# Patient Record
Sex: Male | Born: 1965 | Race: Black or African American | Hispanic: No | Marital: Married | Smoking: Former smoker
Health system: Southern US, Community
[De-identification: ages and names within clinical notes are randomized; demographics above are authoritative.]

---

## 2004-07-02 ENCOUNTER — Emergency Department: Payer: Self-pay | Admitting: Emergency Medicine

## 2004-08-13 ENCOUNTER — Emergency Department: Payer: Self-pay | Admitting: Unknown Physician Specialty

## 2004-11-17 ENCOUNTER — Emergency Department: Payer: Self-pay | Admitting: Emergency Medicine

## 2014-08-05 ENCOUNTER — Emergency Department
Admit: 2014-08-05 | Disposition: A | Discharge status: Home / Self Care | Payer: Self-pay | Admitting: Emergency Medicine

## 2019-02-25 ENCOUNTER — Emergency Department
Admission: EM | Admit: 2019-02-25 | Discharge: 2019-02-26 | Disposition: A | Discharge status: Home / Self Care | Payer: No Typology Code available for payment source | Attending: Emergency Medicine | Admitting: Emergency Medicine

## 2019-02-25 ENCOUNTER — Emergency Department: Payer: No Typology Code available for payment source

## 2019-02-25 ENCOUNTER — Other Ambulatory Visit: Payer: Self-pay

## 2019-02-25 ENCOUNTER — Encounter: Payer: Self-pay | Admitting: Emergency Medicine

## 2019-02-25 DIAGNOSIS — Z87891 Personal history of nicotine dependence: Secondary | ICD-10-CM | POA: Insufficient documentation

## 2019-02-25 DIAGNOSIS — R079 Chest pain, unspecified: Secondary | ICD-10-CM | POA: Insufficient documentation

## 2019-02-25 DIAGNOSIS — R1084 Generalized abdominal pain: Secondary | ICD-10-CM | POA: Insufficient documentation

## 2019-02-25 LAB — LIPASE, BLOOD: Lipase: 22 U/L (ref 11–51)

## 2019-02-25 LAB — TROPONIN I (HIGH SENSITIVITY)
Troponin I (High Sensitivity): <2 ng/L (ref <18)
Troponin I (High Sensitivity): 5 ng/L (ref <18)

## 2019-02-25 LAB — BASIC METABOLIC PANEL
Anion gap: 8 (ref 5–15)
BUN: 15 mg/dL (ref 6–20)
CO2: 22 mmol/L (ref 22–32)
Calcium: 9.4 mg/dL (ref 8.9–10.3)
Chloride: 108 mmol/L (ref 98–111)
Creatinine, Ser: 1.11 mg/dL (ref 0.61–1.24)
GFR calc Af Amer: >60 mL/min (ref >60)
GFR calc non Af Amer: >60 mL/min (ref >60)
Glucose, Bld: 99 mg/dL (ref 70–99)
Potassium: 4.1 mmol/L (ref 3.5–5.1)
Sodium: 138 mmol/L (ref 135–145)

## 2019-02-25 LAB — CBC
HCT: 42.1 % (ref 39.0–52.0)
Hemoglobin: 13.5 g/dL (ref 13.0–17.0)
MCH: 27.4 pg (ref 26.0–34.0)
MCHC: 32.1 g/dL (ref 30.0–36.0)
MCV: 85.6 fL (ref 80.0–100.0)
Platelets: 229 10*3/uL (ref 150–400)
RBC: 4.92 MIL/uL (ref 4.22–5.81)
RDW: 14.2 % (ref 11.5–15.5)
WBC: 6.7 10*3/uL (ref 4.0–10.5)
nRBC: 0 % (ref 0.0–0.2)

## 2019-02-25 LAB — HEPATIC FUNCTION PANEL
ALT: 24 U/L (ref 0–44)
AST: 22 U/L (ref 15–41)
Albumin: 4 g/dL (ref 3.5–5.0)
Alkaline Phosphatase: 69 U/L (ref 38–126)
Bilirubin, Direct: <0.1 mg/dL (ref 0.0–0.2)
Total Bilirubin: 0.5 mg/dL (ref 0.3–1.2)
Total Protein: 7.6 g/dL (ref 6.5–8.1)

## 2019-02-25 MED ORDER — LIDOCAINE VISCOUS HCL 2 % MT SOLN
15.0000 mL | Freq: Once | OROMUCOSAL | Status: AC
  Administered 2019-02-25: 15 mL via ORAL
  Filled 2019-02-25: qty 15

## 2019-02-25 MED ORDER — PANTOPRAZOLE SODIUM 20 MG PO TBEC
20.0000 mg | DELAYED_RELEASE_TABLET | Freq: Once | ORAL | Status: AC
  Administered 2019-02-25: 20 mg via ORAL
  Filled 2019-02-25: qty 1

## 2019-02-25 MED ORDER — PANTOPRAZOLE SODIUM 20 MG PO TBEC: 20.0000 mg | DELAYED_RELEASE_TABLET | Freq: Every day | ORAL | 0 refills | Status: DC

## 2019-02-25 MED ORDER — IOHEXOL 350 MG/ML SOLN
100.0000 mL | Freq: Once | INTRAVENOUS | Status: AC | PRN
  Administered 2019-02-25: 100 mL via INTRAVENOUS

## 2019-02-25 MED ORDER — SUCRALFATE 1 G PO TABS: 1.0000 g | ORAL_TABLET | Freq: Four times a day (QID) | ORAL | 0 refills | Status: AC

## 2019-02-25 MED ORDER — ONDANSETRON HCL 4 MG/2ML IJ SOLN
4.0000 mg | Freq: Once | INTRAMUSCULAR | Status: AC
  Administered 2019-02-25: 4 mg via INTRAVENOUS
  Filled 2019-02-25: qty 2

## 2019-02-25 MED ORDER — ALUM & MAG HYDROXIDE-SIMETH 200-200-20 MG/5ML PO SUSP
30.0000 mL | Freq: Once | ORAL | Status: AC
  Administered 2019-02-25: 30 mL via ORAL
  Filled 2019-02-25: qty 30

## 2019-02-25 MED ORDER — ACETAMINOPHEN 500 MG PO TABS
1000.0000 mg | ORAL_TABLET | Freq: Once | ORAL | Status: AC
  Administered 2019-02-25: 1000 mg via ORAL
  Filled 2019-02-25: qty 2

## 2019-02-25 MED ORDER — SUCRALFATE 1 G PO TABS
1.0000 g | ORAL_TABLET | Freq: Once | ORAL | Status: AC
  Administered 2019-02-25: 1 g via ORAL
  Filled 2019-02-25: qty 1

## 2019-02-25 MED ORDER — KETOROLAC TROMETHAMINE 30 MG/ML IJ SOLN
15.0000 mg | Freq: Once | INTRAMUSCULAR | Status: AC
  Administered 2019-02-25: 15 mg via INTRAVENOUS
  Filled 2019-02-25: qty 1

## 2019-02-25 NOTE — ED Triage Notes (Signed)
Pt arrived via POV with reports of chest pain that comes and goes for the past several weeks.  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  Pt reports hx of acid reflux as well.  Pt states about 2 weeks ago he got short of breath and dizzy while at a truck stop, but did not get evaluated.  Pt denies any PMH and is currently not on any medications.  Pt denies any BLE or history of blood clots.

## 2019-02-25 NOTE — ED Provider Notes (Signed)
[facility] Emergency Department Provider Note  ____________________________________________   First MD Initiated Contact with Patient 02/25/19 2130     (approximate)  I have reviewed the triage vital signs and the nursing notes.   HISTORY  Chief Complaint Chest Pain    HPI Craig Mills is a 53 y.o. 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  Patient says he has had having intermittent chest pain for the past 2 weeks that is severe, sharp stabbing sensation, nothing makes it better, nothing seems to bring it on.  He had an episode 2 weeks ago where he felt dizzy and short of breath with it.  He is a [employer].  No leg swelling.  He also endorses severe abdominal pain that is been going on for the past few weeks as well, intermittent, nothing brings it on, nothing makes it worse.  He says that he just feels that there is something wrong with him and that he needs to be further evaluated.    History reviewed. No pertinent past medical history.  There are no active problems to display for this patient.   History reviewed. No pertinent surgical history.  Prior to Admission medications   Not on File    Allergies Aspirin  No family history on file.  Social History Social History   Tobacco Use   Smoking status: Former Smoker   Smokeless tobacco: Never Used  Substance Use Topics   Alcohol use: Yes   Drug use: Not on file      Review of Systems Constitutional: No fever/chills Eyes: No visual changes. ENT: No sore throat. Cardiovascular: Positive chest pain Respiratory: Denies shortness of breath currently but prior has had  Gastrointestinal: + abd pain no nausea, no vomiting.  No diarrhea.  No constipation. Genitourinary: Negative for dysuria. Musculoskeletal: Negative for back pain. Skin: Negative for rash. Neurological: Negative for headaches, focal weakness or numbness. All  other ROS negative ____________________________________________   PHYSICAL EXAM:  VITAL SIGNS: ED Triage Vitals  Enc Vitals Group     BP 02/25/19 1738 128/76     Pulse Rate 02/25/19 1738 93     Resp 02/25/19 1738 16     Temp 02/25/19 1738 99.7 F (37.6 C)     Temp Source 02/25/19 1738 Oral     SpO2 02/25/19 1738 92 %     Weight 02/25/19 1753 190 lb (86.2 kg)     Height 02/25/19 1753 5\' 9"  (1.753 m)     Head Circumference --      Peak Flow --      Pain Score 02/25/19 1753 8     Pain Loc --      Pain Edu? --      Excl. in GC? --     Constitutional: Alert and oriented. Well appearing and in no acute distress. Eyes: Conjunctivae are normal. EOMI. Head: Atraumatic. Nose: No congestion/rhinnorhea. Mouth/Throat: Mucous membranes are moist.   Neck: No stridor. Trachea Midline. FROM Cardiovascular: Normal rate, regular rhythm. Grossly normal heart sounds.  Good peripheral circulation. Respiratory: Normal respiratory effort.  No retractions. Lungs CTAB. Gastrointestinal: Soft and nontender but reports tendernessness diffusely without touching . No distention. No abdominal bruits.  Musculoskeletal: No lower extremity tenderness nor edema.  No joint effusions. Neurologic:  Normal speech and language. No gross focal neurologic deficits are appreciated.  Skin:  Skin is warm, dry and intact. No rash noted. Psychiatric: Mood and affect are  normal. Speech and behavior are normal. GU: Deferred   ____________________________________________   LABS (all labs ordered are listed, but only abnormal results are displayed)  Labs Reviewed  BASIC METABOLIC PANEL  CBC  TROPONIN I (HIGH SENSITIVITY)  TROPONIN I (HIGH SENSITIVITY)   ____________________________________________   ED ECG REPORT I, Concha Se, the attending physician, personally viewed and interpreted this ECG.  EKG shows normal sinus rate of 92, no ST elevation, T wave inversion in lead  III ____________________________________________  RADIOLOGY Vela Prose, personally viewed and evaluated these images (plain radiographs) as part of my medical decision making, as well as reviewing the written report by the radiologist.  ED MD interpretation:  No pna   Official radiology report(s): Dg Chest 2 View  Result Date: 02/25/2019 CLINICAL DATA:  Chest pain EXAM: CHEST - 2 VIEW COMPARISON:  None. FINDINGS: The heart size and mediastinal contours are within normal limits. Both lungs are clear. The visualized skeletal structures are unremarkable. IMPRESSION: No active cardiopulmonary disease. Electronically Signed   By: Gerome Sam III M.D   On: 02/25/2019 19:01   Ct Angio Chest Pe W And/or Wo Contrast  Result Date: 02/25/2019 CLINICAL DATA:  Abdominal distension, chest pain and comes and goes for several weeks, long distance truck driver, RIGHT and chest pain with intermittent LEFT arm numbness and back pain, acid reflux, episode of dizziness and shortness of breath 2 weeks ago, former smoker EXAM: CT ANGIOGRAPHY CHEST CT ABDOMEN AND PELVIS WITH CONTRAST TECHNIQUE: Multidetector CT imaging of the chest was performed using the standard protocol during bolus administration of intravenous contrast. Multiplanar CT image reconstructions and MIPs were obtained to evaluate the vascular anatomy. Multidetector CT imaging of the abdomen and pelvis was performed using the standard protocol during bolus administration of intravenous contrast. CONTRAST:  OMNIPAQUE IOHEXOL 350 MG/ML SOLN COMPARISON:  None FINDINGS: CTA CHEST FINDINGS Cardiovascular: Aorta normal caliber without aneurysm or dissection. Heart unremarkable. No pericardial effusion. Pulmonary arteries adequately opacified and patent. No evidence of pulmonary embolism. Mediastinum/Nodes: Minimal thickening of distal esophagus diffusely which could be related to reflux. Base of cervical region normal appearance. No thoracic  adenopathy. Lungs/Pleura: Minimal dependent atelectasis in the posterior lungs. Anterior blebs RIGHT upper lobe. Lungs otherwise clear. No pulmonary infiltrate, pleural effusion, or pneumothorax. Musculoskeletal: No acute osseous findings. Review of the MIP images confirms the above findings. CT ABDOMEN and PELVIS FINDINGS Hepatobiliary: Cyst LEFT lobe liver lateral segment 19 x 13 mm image 19. 6 mm nonspecific low-attenuation focus lateral segment LEFT lobe image 11. Gallbladder and liver otherwise normal appearance Pancreas: Normal appearance Spleen: Normal appearance Adrenals/Urinary Tract: Adrenal glands, kidneys, ureters, and bladder normal appearance Stomach/Bowel: Normal appendix. Stomach decompressed. Bowel loops unremarkable. Vascular/Lymphatic: Atherosclerotic calcifications aorta and iliac arteries without aneurysm. Reproductive: Prostate gland and seminal vesicles unremarkable Other: No free air or free fluid. No inflammatory process or hernia. Musculoskeletal: Unremarkable Review of the MIP images confirms the above findings. IMPRESSION: No evidence of pulmonary embolism. No acute intrathoracic process. No acute intra-abdominal or intrapelvic abnormalities. Electronically Signed   By: Ulyses Southward M.D.   On: 02/25/2019 22:40   Ct Abdomen Pelvis W Contrast  Result Date: 02/25/2019 CLINICAL DATA:  Abdominal distension, chest pain and comes and goes for several weeks, long distance truck driver, RIGHT and chest pain with intermittent LEFT arm numbness and back pain, acid reflux, episode of dizziness and shortness of breath 2 weeks ago, former smoker EXAM: CT ANGIOGRAPHY CHEST CT ABDOMEN AND PELVIS WITH  CONTRAST TECHNIQUE: Multidetector CT imaging of the chest was performed using the standard protocol during bolus administration of intravenous contrast. Multiplanar CT image reconstructions and MIPs were obtained to evaluate the vascular anatomy. Multidetector CT imaging of the abdomen and pelvis was  performed using the standard protocol during bolus administration of intravenous contrast. CONTRAST:  100mL OMNIPAQUE IOHEXOL 350 MG/ML SOLN COMPARISON:  None FINDINGS: CTA CHEST FINDINGS Cardiovascular: Aorta normal caliber without aneurysm or dissection. Heart unremarkable. No pericardial effusion. Pulmonary arteries adequately opacified and patent. No evidence of pulmonary embolism. Mediastinum/Nodes: Minimal thickening of distal esophagus diffusely which could be related to reflux. Base of cervical region normal appearance. No thoracic adenopathy. Lungs/Pleura: Minimal dependent atelectasis in the posterior lungs. Anterior blebs RIGHT upper lobe. Lungs otherwise clear. No pulmonary infiltrate, pleural effusion, or pneumothorax. Musculoskeletal: No acute osseous findings. Review of the MIP images confirms the above findings. CT ABDOMEN and PELVIS FINDINGS Hepatobiliary: Cyst LEFT lobe liver lateral segment 19 x 13 mm image 19. 6 mm nonspecific low-attenuation focus lateral segment LEFT lobe image 11. Gallbladder and liver otherwise normal appearance Pancreas: Normal appearance Spleen: Normal appearance Adrenals/Urinary Tract: Adrenal glands, kidneys, ureters, and bladder normal appearance Stomach/Bowel: Normal appendix. Stomach decompressed. Bowel loops unremarkable. Vascular/Lymphatic: Atherosclerotic calcifications aorta and iliac arteries without aneurysm. Reproductive: Prostate gland and seminal vesicles unremarkable Other: No free air or free fluid. No inflammatory process or hernia. Musculoskeletal: Unremarkable Review of the MIP images confirms the above findings. IMPRESSION: No evidence of pulmonary embolism. No acute intrathoracic process. No acute intra-abdominal or intrapelvic abnormalities. Electronically Signed   By: Mark  Boles M.Ulyses Southward.   On: 02/25/2019 22:40    ____________________________________________   PROCEDURES  Procedure(s) performed (including Critical  Care):  Procedures   ____________________________________________   INITIAL IMPRESSION / ASSESSMENT AND PLAN / ED COURSE   Craig Mills was evaluated in Emergency Department on 02/25/2019 for the symptoms described in the history of present illness. He was evaluated in the context of the global COVID-19 pandemic, which necessitated consideration that the patient might be at risk for infection with the SARS-CoV-2 virus that causes COVID-19. Institutional protocols and algorithms that pertain to the evaluation of patients at risk for COVID-19 are in a state of rapid change based on information released by regulatory bodies including the CDC and federal and state organizations. These policies and algorithms were followed during the patient's care in the ED.    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  Denies any urinary symptoms to suggest UTI    DDx that was also considered d/t potential to cause harm, but was found less likely based on history and physical (as detailed above): -PNA (no fevers, cough but CXR to evaluate) -PNX (reassured with equal b/l breath sounds, CXR to evaluate) -Symptomatic anemia (will get H&H) -Aortic Dissection as no tearing pain and no radiation to the mid back, pulses equal -Pericarditis no rub on exam, EKG changes or hx to suggest dx -Tamponade (no notable SOB, tachycardic, hypotensive) -Esophageal rupture (no h/o diffuse vomitting/no crepitus)   Chest x-ray without evidence of pneumonia or pneumothorax.  Cardiac marker is negative.  No anemia.  No elevated white count.  CT scans were negative of the chest and belly.  Evaluated patient.  Patient says that in the past he has  been treated for acid reflux and is helped his symptoms.  Will give GI cocktail,  Pepcid, Carafate.  Explained to patient that he should follow-up with his primary care doctor and will give a short course of Pepcid and Carafate.  Patient felt comfortable with this plan.      ____________________________________________   FINAL CLINICAL IMPRESSION(S) / ED DIAGNOSES   Final diagnoses:  Chest pain, unspecified type  Generalized abdominal pain     MEDICATIONS GIVEN DURING THIS VISIT:  Medications  alum & mag hydroxide-simeth (MAALOX/MYLANTA) 200-200-20 MG/5ML suspension 30 mL (has no administration in time range)    And  lidocaine (XYLOCAINE) 2 % viscous mouth solution 15 mL (has no administration in time range)  sucralfate (CARAFATE) tablet 1 g (has no administration in time range)  pantoprazole (PROTONIX) EC tablet 20 mg (has no administration in time range)  ketorolac (TORADOL) 30 MG/ML injection 15 mg (15 mg Intravenous Given 02/25/19 2204)  ondansetron (ZOFRAN) injection 4 mg (4 mg Intravenous Given 02/25/19 2204)  acetaminophen (TYLENOL) tablet 1,000 mg (1,000 mg Oral Given 02/25/19 2204)  iohexol (OMNIPAQUE) 350 MG/ML injection 100 mL (100 mLs Intravenous Contrast Given 02/25/19 2213)     ED Discharge Orders         Ordered    sucralfate (CARAFATE) 1 g tablet  4 times daily     02/25/19 2319    pantoprazole (PROTONIX) 20 MG tablet  Daily     02/25/19 2319           Note:  This document was prepared using Dragon voice recognition software and may include unintentional dictation errors.   Vanessa Crosslake, MD 02/25/19 [phone]

## 2019-02-25 NOTE — Discharge Instructions (Addendum)
CT scans were negative.  Your labs are also reassuring.  This could be related to acid reflux.  We are starting you on acid reducer and Carafate to help line the stomach.  You should follow-up with your primary care doctor for further work-up.

## 2019-11-13 ENCOUNTER — Encounter: Payer: Self-pay | Admitting: Emergency Medicine

## 2019-11-13 ENCOUNTER — Emergency Department
Admission: EM | Admit: 2019-11-13 | Discharge: 2019-11-13 | Disposition: A | Discharge status: Home / Self Care | Payer: Self-pay | Attending: Emergency Medicine | Admitting: Emergency Medicine

## 2019-11-13 ENCOUNTER — Emergency Department: Payer: Self-pay

## 2019-11-13 ENCOUNTER — Other Ambulatory Visit: Payer: Self-pay

## 2019-11-13 DIAGNOSIS — M5412 Radiculopathy, cervical region: Secondary | ICD-10-CM | POA: Insufficient documentation

## 2019-11-13 LAB — BASIC METABOLIC PANEL
Anion gap: 8 (ref 5–15)
BUN: 15 mg/dL (ref 6–20)
CO2: 25 mmol/L (ref 22–32)
Calcium: 8.9 mg/dL (ref 8.9–10.3)
Chloride: 109 mmol/L (ref 98–111)
Creatinine, Ser: 0.99 mg/dL (ref 0.61–1.24)
GFR calc Af Amer: >60 mL/min (ref >60)
GFR calc non Af Amer: >60 mL/min (ref >60)
Glucose, Bld: 98 mg/dL (ref 70–99)
Potassium: 3.7 mmol/L (ref 3.5–5.1)
Sodium: 142 mmol/L (ref 135–145)

## 2019-11-13 LAB — CBC
HCT: 37.2 % — ABNORMAL LOW (ref 39.0–52.0)
Hemoglobin: 12.7 g/dL — ABNORMAL LOW (ref 13.0–17.0)
MCH: 28.7 pg (ref 26.0–34.0)
MCHC: 34.1 g/dL (ref 30.0–36.0)
MCV: 84 fL (ref 80.0–100.0)
Platelets: 167 10*3/uL (ref 150–400)
RBC: 4.43 MIL/uL (ref 4.22–5.81)
RDW: 14.1 % (ref 11.5–15.5)
WBC: 5.9 10*3/uL (ref 4.0–10.5)
nRBC: 0 % (ref 0.0–0.2)

## 2019-11-13 LAB — TROPONIN I (HIGH SENSITIVITY): Troponin I (High Sensitivity): 5 ng/L (ref <18)

## 2019-11-13 MED ORDER — SODIUM CHLORIDE 0.9% FLUSH: 3.0000 mL | Freq: Once | INTRAVENOUS | Status: DC

## 2019-11-13 MED ORDER — PREDNISONE 10 MG (21) PO TBPK: ORAL_TABLET | ORAL | 0 refills | Status: DC

## 2019-11-13 MED ORDER — PREDNISONE 20 MG PO TABS
60.0000 mg | ORAL_TABLET | Freq: Once | ORAL | Status: AC
  Administered 2019-11-13: 60 mg via ORAL
  Filled 2019-11-13: qty 6

## 2019-11-13 MED ORDER — CYCLOBENZAPRINE HCL 10 MG PO TABS: 10.0000 mg | ORAL_TABLET | Freq: Three times a day (TID) | ORAL | 0 refills | Status: AC | PRN

## 2019-11-13 NOTE — ED Notes (Addendum)
Pt states he does not have a primary care doctor. Pt reports he had short term episodes of blurry vision a few months ago  Pt denies smoking or drug use, admits to "a beer or two occasionally"

## 2019-11-13 NOTE — ED Notes (Addendum)
Pt reports intermittent episodes of chest pressure with numbness that extends from the left side of his neck down his arm through his left fingers. Pt reports feeling dizzy/lightheaded intermittently  Pt reports he is anxious about not having found a cause for the chest discomfort. Pt denies fevers, nausea, sharp chest pain, abdominal pain  Pain seen in November for same without identifiable cause

## 2019-11-13 NOTE — ED Provider Notes (Signed)
ER Provider Note       Time seen: 6:58 AM    I have reviewed the vital signs and the nursing notes.  HISTORY   Chief Complaint Arm Pain, Numbness, and Chest Pain   HPI Craig Mills is a 54 y.o. male with no significant past medical history who presents today for recurrent left-sided chest and shoulder pain with radiating pain down his left arm.  He reports some numbness and tingling in his fingertips, does not feel safe to drive because of his symptoms.  Discomfort is 8 out of 10.  History reviewed. No pertinent past medical history.  History reviewed. No pertinent surgical history.  Allergies Aspirin  Review of Systems Constitutional: Negative for fever. Cardiovascular: Negative for chest pain. Respiratory: Negative for shortness of breath. Gastrointestinal: Negative for abdominal pain, vomiting and diarrhea. Musculoskeletal: Positive for pain in the left arm Skin: Negative for rash. Neurological: Negative for headaches, positive for paresthesias in the left arm  All systems negative/normal/unremarkable except as stated in the HPI  ____________________________________________   PHYSICAL EXAM:  VITAL SIGNS: Vitals:   11/13/19 0518 11/13/19 0611  BP:  126/76  Pulse: 84 78  Resp: (!) 22 16  Temp: 98.6 F (37 C)   SpO2: 98% 98%    Constitutional: Alert and oriented. Well appearing and in no distress. Eyes: Conjunctivae are normal. Normal extraocular movements. ENT      Head: Normocephalic and atraumatic.      Nose: No congestion/rhinnorhea.      Mouth/Throat: Mucous membranes are moist.      Neck: No stridor. Cardiovascular: Normal rate, regular rhythm. No murmurs, rubs, or gallops. Respiratory: Normal respiratory effort without tachypnea nor retractions. Breath sounds are clear and equal bilaterally. No wheezes/rales/rhonchi. Gastrointestinal: Soft and nontender. Normal bowel sounds Musculoskeletal: Nontender with normal range of motion in extremities.  No lower extremity tenderness nor edema. Neurologic:  Normal speech and language. No gross focal neurologic deficits are appreciated.  Radicular pain down the left arm that is diffuse, describes paresthesias in the fingertips.  No significant numbness or weakness is noted Skin:  Skin is warm, dry and intact. No rash noted. Psychiatric: Speech and behavior are normal.  ____________________________________________  EKG: Interpreted by me.  Sinus rhythm with rate of 78 bpm, normal PR interval, normal QRS, normal QT  ____________________________________________   LABS (pertinent positives/negatives)  Labs Reviewed  CBC - Abnormal; Notable for the following components:      Result Value   Hemoglobin 12.7 (*)    HCT 37.2 (*)    All other components within normal limits  BASIC METABOLIC PANEL  TROPONIN I (HIGH SENSITIVITY)    RADIOLOGY  Images were viewed by me Chest x-ray Is unremarkable  DIFFERENTIAL DIAGNOSIS  Musculoskeletal pain, cervical radiculopathy, strain, spasm, MI  ASSESSMENT AND PLAN  Cervical radiculopathy   Plan: The patient had presented for radiating pain down his left arm. Patient's labs are unremarkable and he is low risk for ACS according to heart score.  We will try course of steroids and muscle relaxants.  He will be referred to his primary care doctor and encouraged to physical therapy and follow-up.  Daryel November MD    Note: This note was generated in part or whole with voice recognition software. Voice recognition is usually quite accurate but there are transcription errors that can and very often do occur. I apologize for any typographical errors that were not detected and corrected.     Emily Filbert, MD 11/13/19 [phone]

## 2019-11-13 NOTE — ED Triage Notes (Signed)
Pt presents to ED with c/o reoccurring left sided chest pain over the past few weeks with left sided arm pain and numbness that radiates from his shoulder and down his arm. 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 Pt states he just doesn't want to hurt anyone while driving and feels that something just isn't right". Pt alert and answering questions without difficulty. No acute distress noted. Skin warm and dry.

## 2020-03-28 ENCOUNTER — Other Ambulatory Visit: Payer: Self-pay

## 2020-03-28 ENCOUNTER — Encounter: Payer: Self-pay | Admitting: *Deleted

## 2020-03-28 ENCOUNTER — Emergency Department
Admission: EM | Admit: 2020-03-28 | Discharge: 2020-03-28 | Disposition: A | Discharge status: Home / Self Care | Payer: PRIVATE HEALTH INSURANCE | Attending: Emergency Medicine | Admitting: Emergency Medicine

## 2020-03-28 ENCOUNTER — Emergency Department: Payer: PRIVATE HEALTH INSURANCE

## 2020-03-28 DIAGNOSIS — R0789 Other chest pain: Secondary | ICD-10-CM | POA: Insufficient documentation

## 2020-03-28 DIAGNOSIS — R600 Localized edema: Secondary | ICD-10-CM | POA: Insufficient documentation

## 2020-03-28 DIAGNOSIS — R42 Dizziness and giddiness: Secondary | ICD-10-CM | POA: Insufficient documentation

## 2020-03-28 DIAGNOSIS — Z87891 Personal history of nicotine dependence: Secondary | ICD-10-CM | POA: Insufficient documentation

## 2020-03-28 LAB — CBC
HCT: 37.7 % — ABNORMAL LOW (ref 39.0–52.0)
Hemoglobin: 12.3 g/dL — ABNORMAL LOW (ref 13.0–17.0)
MCH: 28.2 pg (ref 26.0–34.0)
MCHC: 32.6 g/dL (ref 30.0–36.0)
MCV: 86.5 fL (ref 80.0–100.0)
Platelets: 156 10*3/uL (ref 150–400)
RBC: 4.36 MIL/uL (ref 4.22–5.81)
RDW: 14.4 % (ref 11.5–15.5)
WBC: 6.6 10*3/uL (ref 4.0–10.5)
nRBC: 0 % (ref 0.0–0.2)

## 2020-03-28 LAB — BASIC METABOLIC PANEL
Anion gap: 12 (ref 5–15)
BUN: 18 mg/dL (ref 6–20)
CO2: 22 mmol/L (ref 22–32)
Calcium: 8.9 mg/dL (ref 8.9–10.3)
Chloride: 108 mmol/L (ref 98–111)
Creatinine, Ser: 0.92 mg/dL (ref 0.61–1.24)
GFR, Estimated: >60 mL/min (ref >60)
Glucose, Bld: 126 mg/dL — ABNORMAL HIGH (ref 70–99)
Potassium: 3.5 mmol/L (ref 3.5–5.1)
Sodium: 142 mmol/L (ref 135–145)

## 2020-03-28 LAB — TROPONIN I (HIGH SENSITIVITY)
Troponin I (High Sensitivity): 3 ng/L (ref <18)
Troponin I (High Sensitivity): 3 ng/L (ref <18)

## 2020-03-28 MED ORDER — PANTOPRAZOLE SODIUM 20 MG PO TBEC
20.0000 mg | DELAYED_RELEASE_TABLET | Freq: Every day | ORAL | 1 refills | Status: DC
Start: 2020-03-28 — End: 2022-03-13

## 2020-03-28 MED ORDER — LIDOCAINE VISCOUS HCL 2 % MT SOLN
15.0000 mL | Freq: Once | OROMUCOSAL | Status: AC
  Administered 2020-03-28: 15 mL via ORAL
  Filled 2020-03-28: qty 15

## 2020-03-28 MED ORDER — KETOROLAC TROMETHAMINE 30 MG/ML IJ SOLN
30.0000 mg | Freq: Once | INTRAMUSCULAR | Status: AC
  Administered 2020-03-28: 30 mg via INTRAVENOUS
  Filled 2020-03-28: qty 1

## 2020-03-28 MED ORDER — ALUM & MAG HYDROXIDE-SIMETH 200-200-20 MG/5ML PO SUSP
30.0000 mL | Freq: Once | ORAL | Status: AC
  Administered 2020-03-28: 30 mL via ORAL
  Filled 2020-03-28: qty 30
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
## 2020-03-28 NOTE — ED Notes (Signed)
ED Provider at bedside. 
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
## 2020-03-28 NOTE — ED Notes (Signed)
RN spoke with Dr. Dolores Frame for orders. Cardiac workup at this time. No CT head per Dr. Dolores Frame.

## 2020-06-25 ENCOUNTER — Other Ambulatory Visit: Payer: Self-pay

## 2020-06-25 ENCOUNTER — Encounter: Payer: Self-pay | Admitting: Emergency Medicine

## 2020-06-25 ENCOUNTER — Emergency Department
Admission: EM | Admit: 2020-06-25 | Discharge: 2020-06-25 | Disposition: A | Discharge status: Home / Self Care | Payer: Self-pay | Attending: Emergency Medicine | Admitting: Emergency Medicine

## 2020-06-25 ENCOUNTER — Emergency Department: Payer: Self-pay

## 2020-06-25 DIAGNOSIS — Z113 Encounter for screening for infections with a predominantly sexual mode of transmission: Secondary | ICD-10-CM | POA: Insufficient documentation

## 2020-06-25 DIAGNOSIS — Z7689 Persons encountering health services in other specified circumstances: Secondary | ICD-10-CM

## 2020-06-25 DIAGNOSIS — R0789 Other chest pain: Secondary | ICD-10-CM | POA: Insufficient documentation

## 2020-06-25 DIAGNOSIS — Z87891 Personal history of nicotine dependence: Secondary | ICD-10-CM | POA: Insufficient documentation

## 2020-06-25 DIAGNOSIS — R202 Paresthesia of skin: Secondary | ICD-10-CM | POA: Insufficient documentation

## 2020-06-25 DIAGNOSIS — R1013 Epigastric pain: Secondary | ICD-10-CM | POA: Insufficient documentation

## 2020-06-25 DIAGNOSIS — R079 Chest pain, unspecified: Secondary | ICD-10-CM

## 2020-06-25 LAB — CHLAMYDIA/NGC RT PCR (ARMC ONLY)
Chlamydia Tr: NOT DETECTED
N gonorrhoeae: NOT DETECTED

## 2020-06-25 LAB — CBC
HCT: 40.5 % (ref 39.0–52.0)
Hemoglobin: 13.6 g/dL (ref 13.0–17.0)
MCH: 28.3 pg (ref 26.0–34.0)
MCHC: 33.6 g/dL (ref 30.0–36.0)
MCV: 84.4 fL (ref 80.0–100.0)
Platelets: 196 10*3/uL (ref 150–400)
RBC: 4.8 MIL/uL (ref 4.22–5.81)
RDW: 14.1 % (ref 11.5–15.5)
WBC: 6.4 10*3/uL (ref 4.0–10.5)
nRBC: 0 % (ref 0.0–0.2)

## 2020-06-25 LAB — BASIC METABOLIC PANEL
Anion gap: 9 (ref 5–15)
BUN: 13 mg/dL (ref 6–20)
CO2: 20 mmol/L — ABNORMAL LOW (ref 22–32)
Calcium: 9.2 mg/dL (ref 8.9–10.3)
Chloride: 108 mmol/L (ref 98–111)
Creatinine, Ser: 0.91 mg/dL (ref 0.61–1.24)
GFR, Estimated: >60 mL/min (ref >60)
Glucose, Bld: 90 mg/dL (ref 70–99)
Potassium: 4.2 mmol/L (ref 3.5–5.1)
Sodium: 137 mmol/L (ref 135–145)

## 2020-06-25 LAB — TROPONIN I (HIGH SENSITIVITY): Troponin I (High Sensitivity): <2 ng/L (ref <18)

## 2020-06-25 MED ORDER — DOXYCYCLINE HYCLATE 100 MG PO CAPS: 100.0000 mg | ORAL_CAPSULE | Freq: Two times a day (BID) | ORAL | 0 refills | Status: AC

## 2020-06-25 MED ORDER — CEFTRIAXONE SODIUM 1 G IJ SOLR
500.0000 mg | Freq: Once | INTRAMUSCULAR | Status: AC
  Administered 2020-06-25: 500 mg via INTRAMUSCULAR
  Filled 2020-06-25: qty 10

## 2020-06-25 MED ORDER — METRONIDAZOLE 500 MG PO TABS
500.0000 mg | ORAL_TABLET | Freq: Two times a day (BID) | ORAL | 0 refills | Status: DC
Start: 2020-06-25 — End: 2020-06-25

## 2020-06-25 MED ORDER — METRONIDAZOLE 500 MG PO TABS
500.0000 mg | ORAL_TABLET | Freq: Two times a day (BID) | ORAL | 0 refills | Status: AC
Start: 2020-06-25 — End: 2020-07-02

## 2020-06-25 MED ORDER — DOXYCYCLINE HYCLATE 100 MG PO CAPS: 100.0000 mg | ORAL_CAPSULE | Freq: Two times a day (BID) | ORAL | 0 refills | Status: DC
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
## 2020-06-25 NOTE — ED Notes (Signed)
Pt alert and oriented X 4, stable for discharge. RR even and unlabored, color WNL. Discussed discharge instructions and follow-up as directed. Discharge medications discussed if prescribed. Pt had opportunity to ask questions, and RN to provide patient/family eduction.  
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
## 2022-03-13 ENCOUNTER — Encounter (HOSPITAL_COMMUNITY): Payer: Self-pay

## 2022-03-13 ENCOUNTER — Emergency Department (HOSPITAL_COMMUNITY)
Admission: EM | Admit: 2022-03-13 | Discharge: 2022-03-13 | Disposition: A | Discharge status: Home / Self Care | Payer: No Typology Code available for payment source | Attending: Emergency Medicine | Admitting: Emergency Medicine

## 2022-03-13 ENCOUNTER — Emergency Department (HOSPITAL_COMMUNITY): Payer: No Typology Code available for payment source

## 2022-03-13 ENCOUNTER — Other Ambulatory Visit: Payer: Self-pay

## 2022-03-13 DIAGNOSIS — R12 Heartburn: Secondary | ICD-10-CM | POA: Diagnosis not present

## 2022-03-13 DIAGNOSIS — R1013 Epigastric pain: Secondary | ICD-10-CM | POA: Insufficient documentation

## 2022-03-13 DIAGNOSIS — R066 Hiccough: Secondary | ICD-10-CM | POA: Insufficient documentation

## 2022-03-13 LAB — BASIC METABOLIC PANEL
Anion gap: 11 (ref 5–15)
BUN: 14 mg/dL (ref 6–20)
CO2: 22 mmol/L (ref 22–32)
Calcium: 9.8 mg/dL (ref 8.9–10.3)
Chloride: 103 mmol/L (ref 98–111)
Creatinine, Ser: 1.21 mg/dL (ref 0.61–1.24)
GFR, Estimated: >60 mL/min (ref >60)
Glucose, Bld: 100 mg/dL — ABNORMAL HIGH (ref 70–99)
Potassium: 3.9 mmol/L (ref 3.5–5.1)
Sodium: 136 mmol/L (ref 135–145)

## 2022-03-13 LAB — CBC
HCT: 41.5 % (ref 39.0–52.0)
Hemoglobin: 13.6 g/dL (ref 13.0–17.0)
MCH: 28 pg (ref 26.0–34.0)
MCHC: 32.8 g/dL (ref 30.0–36.0)
MCV: 85.4 fL (ref 80.0–100.0)
Platelets: 250 10*3/uL (ref 150–400)
RBC: 4.86 MIL/uL (ref 4.22–5.81)
RDW: 14.2 % (ref 11.5–15.5)
WBC: 5.8 10*3/uL (ref 4.0–10.5)
nRBC: 0 % (ref 0.0–0.2)

## 2022-03-13 LAB — LIPASE, BLOOD: Lipase: 30 U/L (ref 11–51)

## 2022-03-13 LAB — TROPONIN I (HIGH SENSITIVITY): Troponin I (High Sensitivity): 3 ng/L (ref <18)

## 2022-03-13 MED ORDER — POLYETHYLENE GLYCOL 3350 17 G PO PACK: 17.0000 g | PACK | Freq: Every day | ORAL | 0 refills | Status: AC

## 2022-03-13 MED ORDER — PANTOPRAZOLE SODIUM 40 MG IV SOLR
40.0000 mg | Freq: Once | INTRAVENOUS | Status: AC
  Administered 2022-03-13: 40 mg via INTRAVENOUS
  Filled 2022-03-13: qty 10

## 2022-03-13 MED ORDER — ALUM & MAG HYDROXIDE-SIMETH 200-200-20 MG/5ML PO SUSP
30.0000 mL | Freq: Once | ORAL | Status: AC
  Administered 2022-03-13: 30 mL via ORAL
  Filled 2022-03-13: qty 30

## 2022-03-13 MED ORDER — FAMOTIDINE 20 MG PO TABS
20.0000 mg | ORAL_TABLET | Freq: Once | ORAL | Status: AC
  Administered 2022-03-13: 20 mg via ORAL
  Filled 2022-03-13: qty 1

## 2022-03-13 MED ORDER — PANTOPRAZOLE SODIUM 40 MG PO TBEC: 40.0000 mg | DELAYED_RELEASE_TABLET | Freq: Every day | ORAL | 1 refills | Status: AC

## 2022-03-13 MED ORDER — FAMOTIDINE 20 MG PO TABS: 20.0000 mg | ORAL_TABLET | Freq: Two times a day (BID) | ORAL | 0 refills | Status: AC | PRN

## 2022-03-13 NOTE — ED Provider Notes (Signed)
[facility] EMERGENCY DEPARTMENT Provider Note   CSN: [id number] Arrival date & time: 03/13/22  7341     History  Chief Complaint  Patient presents with   Gastroesophageal Reflux    Craig Mills is a 56 y.o. male with Hx of GERD presenting today due to worsening "sharp burning feeling" in the epigastric region over the last 5 to 7 days.  Worsened with food intake, laying flat, or pressure to the area.  Associated with hiccups and occasional radiation of pain to the sides.  Has been occasionally vomiting "thick yellow material".  Denies blood in the vomit or stool.  Reports mild constipation over the last 1 to 2 months, tries to stay hydrated.  Works as a [employer] and does not wish to make frequent stops to use restroom.  Has not tried anything for the constipation.  Denies fever, urinary symptoms, back pain, chest pain, or shortness of breath.  Per record review, seen by [facility] emergency department 12/23/21 with similar complaints.  Was given prescription for gabapentin and 20 mg omeprazole.  Was also recommended to follow up with GI. Do not see records of follow up.  Pt states he was never told to follow up with GI specialist, that he has not refilled these medications, and has not taken them in at least one month.  States "I don't want to take medication if it doesn't take the pain away".    The history is provided by the patient and medical records.      Home Medications Prior to Admission medications   Medication Sig Start Date End Date Taking? Authorizing Provider  famotidine (PEPCID) 20 MG tablet Take 1 tablet (20 mg total) by mouth every 12 (twelve) hours as needed for heartburn or indigestion. 03/13/22  Yes Cecil Cobbs, PA-C  pantoprazole (PROTONIX) 40 MG tablet Take 1 tablet (40 mg total) by mouth daily for 21 days. 03/13/22 04/03/22 Yes Cecil Cobbs, PA-C  polyethylene glycol (MIRALAX / GLYCOLAX) 17 g packet Take 17 g by mouth  daily. 03/13/22  Yes Cecil Cobbs, PA-C  cyclobenzaprine (FLEXERIL) 10 MG tablet Take 1 tablet (10 mg total) by mouth 3 (three) times daily as needed for muscle spasms. 11/13/19   Emily Filbert, MD  sucralfate (CARAFATE) 1 g tablet Take 1 tablet (1 g total) by mouth 4 (four) times daily. 02/25/19 03/27/19  Concha Se, MD      Allergies    Aspirin    Review of Systems   Review of Systems  Gastrointestinal:  Positive for abdominal pain (Epigastric).    Physical Exam Updated Vital Signs BP 120/81   Pulse 87   Temp 98.7 F (37.1 C)   Resp 16   SpO2 99%  Physical Exam Vitals and nursing note reviewed.  Constitutional:      General: He is not in acute distress.    Appearance: Normal appearance. He is well-developed. He is not ill-appearing, toxic-appearing or diaphoretic.  HENT:     Head: Normocephalic and atraumatic.     Mouth/Throat:     Mouth: Mucous membranes are moist.     Pharynx: Oropharynx is clear.  Eyes:     Conjunctiva/sclera: Conjunctivae normal.  Neck:     Comments: Very supple on exam, no torticollis or meningismus Cardiovascular:     Rate and Rhythm: Normal rate and regular rhythm.     Pulses: Normal pulses.     Heart sounds: No murmur heard. Pulmonary:  Effort: Pulmonary effort is normal. No respiratory distress.     Breath sounds: Normal breath sounds. No stridor. No wheezing, rhonchi or rales.  Chest:     Chest wall: No tenderness.  Abdominal:     General: There is no distension.     Palpations: Abdomen is soft. There is no mass or pulsatile mass.     Tenderness: There is abdominal tenderness (Epigastric). There is no right CVA tenderness, left CVA tenderness or guarding. Negative signs include Murphy's sign and McBurney's sign.  Musculoskeletal:        General: No swelling.     Cervical back: Neck supple. No rigidity.  Skin:    General: Skin is warm and dry.     Capillary Refill: Capillary refill takes less than 2 seconds.      Coloration: Skin is not jaundiced or pale.  Neurological:     Mental Status: He is alert and oriented to person, place, and time.  Psychiatric:        Mood and Affect: Mood normal.     ED Results / Procedures / Treatments   Labs (all labs ordered are listed, but only abnormal results are displayed) Labs Reviewed  BASIC METABOLIC PANEL - Abnormal; Notable for the following components:      Result Value   Glucose, Bld 100 (*)    All other components within normal limits  CBC  LIPASE, BLOOD  TROPONIN I (HIGH SENSITIVITY)    EKG Has not transferred over  Radiology DG Chest 2 View  Result Date: 03/13/2022 CLINICAL DATA:  Ongoing reflux with epigastric pain EXAM: CHEST - 2 VIEW COMPARISON:  Chest radiograph dated 06/25/2020 FINDINGS: Normal lung volumes. No focal consolidations. No pleural effusion or pneumothorax. The heart size and mediastinal contours are within normal limits. The visualized skeletal structures are unremarkable. IMPRESSION: No active cardiopulmonary disease. Electronically Signed   By: Agustin Cree M.D.   On: 03/13/2022 10:41    Procedures Procedures    Medications Ordered in ED Medications  alum & mag hydroxide-simeth (MAALOX/MYLANTA) 200-200-20 MG/5ML suspension 30 mL (30 mLs Oral Given 03/13/22 1227)  pantoprazole (PROTONIX) injection 40 mg (40 mg Intravenous Given 03/13/22 1247)  famotidine (PEPCID) tablet 20 mg (20 mg Oral Given 03/13/22 1228)    ED Course/ Medical Decision Making/ A&P           HEART Score: 2                Medical Decision Making Amount and/or Complexity of Data Reviewed Labs: ordered. Radiology: ordered.   56 y.o. male presents to the ED for concern of Gastroesophageal Reflux   This involves an extensive number of treatment options, and is a complaint that carries with it a high risk of complications and morbidity.  The emergent differential diagnosis prior to evaluation includes, but is not limited to: GERD, pancreatitis,  ACS  This is not an exhaustive differential.   Past Medical History / Co-morbidities / Social History: Hx of GERD Social Determinants of Health include: No PCP, resources provided  Additional History:  Obtained by chart review.  Notably recent ED visit at [facility].  See note for details.  Reviewed CT imaging and lab results.  They include: EXAMINATION: CT chest pulmonary angiogram; CT abdomen pelvis with contrast DATE: 12/23/2021 INDICATION: Acute abdominal pain. Vomiting. Chest pain. Shortness of breath COMPARISON: Chest x-ray from 12/23/2021. CT from October 2021 TECHNIQUE: Contiguous axial images were obtained of the chest, abdomen and pelvis with 100 mL Isovue-370  intravenous contrast. 3D MIPS obtained on a separate workstation. Dose reduction was obtained with Automatic Exposure Control (AEC) or, if AEC could not be utilized, by manual adjustment of the mA and/or kV according to patient size. FINDINGS: CHEST: MEDIASTINUM/GREAT VESSELS: Normal heart size. No significant pericardial fluid. No significant adenopathy. LUNGS: No consolidation or pleural effusion. Airway appears patent. There is paraseptal emphysema with subpleural bleb formation ABDOMEN/PELVIS: Low-density liver lesions incompletely characterized, the largest measuring 2 cm in the left hepatic lobe. This was, however, present back in 2021. No acute abnormality liver, gallbladder, biliary tree, pancreas, spleen, adrenal glands, or kidneys. The ureters are not dilated. Urinary bladder shows no acute abnormality. No significant free fluid in the pelvis. There is no bowel obstruction or focal inflammation. Mild fluid levels within the small bowel. The appendix does not appear acutely inflamed.. The aorta is normal caliber. Atherosclerotic plaque deposition is noted. Bone island in the right hemipelvis. No acute or destructive appearing osseous lesions. Posttraumatic changes of the right anterior first rib and costochondral  cartilage. IMPRESSION: 1. No detectable pulmonary embolus. 2. Paraseptal pulmonary emphysema with no acute pulmonary abnormality. 3. Some small bowel fluid levels which could indicate enteritis. No obstruction or inflammatory changes. Electronically Signed by: Tedra Senegal, MD, [facility] Radiology Electronically Signed on: 12/23/2021 5:33 PM  EXAMINATION: CT chest pulmonary angiogram; CT abdomen pelvis with contrast DATE: 12/23/2021 INDICATION: Acute abdominal pain. Vomiting. Chest pain. Shortness of breath COMPARISON: Chest x-ray from 12/23/2021. CT from October 2021 TECHNIQUE: Contiguous axial images were obtained of the chest, abdomen and pelvis with 100 mL Isovue-370 intravenous contrast. 3D MIPS obtained on a separate workstation. Dose reduction was obtained with Automatic Exposure Control (AEC) or, if AEC could not be utilized, by manual adjustment of the mA and/or kV according to patient size. FINDINGS: CHEST: MEDIASTINUM/GREAT VESSELS: Normal heart size. No significant pericardial fluid. No significant adenopathy. LUNGS: No consolidation or pleural effusion. Airway appears patent. There is paraseptal emphysema with subpleural bleb formation ABDOMEN/PELVIS: Low-density liver lesions incompletely characterized, the largest measuring 2 cm in the left hepatic lobe. This was, however, present back in 2021. No acute abnormality liver, gallbladder, biliary tree, pancreas, spleen, adrenal glands, or kidneys. The ureters are not dilated. Urinary bladder shows no acute abnormality. No significant free fluid in the pelvis. There is no bowel obstruction or focal inflammation. Mild fluid levels within the small bowel. The appendix does not appear acutely inflamed.. The aorta is normal caliber. Atherosclerotic plaque deposition is noted. Bone island in the right hemipelvis. No acute or destructive appearing osseous lesions. Posttraumatic changes of the right anterior first rib and costochondral  cartilage. IMPRESSION: 1. No detectable pulmonary embolus. 2. Paraseptal pulmonary emphysema with no acute pulmonary abnormality. 3. Some small bowel fluid levels which could indicate enteritis. No obstruction or inflammatory changes. Electronically Signed by: Tedra Senegal, MD, [facility] Radiology Electronically Signed on: 12/23/2021 5:33 PM  Lab Tests: I ordered, and personally interpreted labs.  The pertinent results include:   No elevated WBC or evidence of anemia No electrolyte derangement or evidence of AKI.  Glucose 100  Imaging Studies: I ordered imaging studies including CXR .   I independently visualized and interpreted imaging which showed no evidence of pneumothorax, pneumonia, cardiomegaly, or other acute cardiopulmonary pathology I agree with the radiologist interpretation.  ED Course: Pt well-appearing on exam.  Nontoxic, nonseptic appearing on exam.  AAOx4.  Presenting with worsening acid reflux symptoms.  Described as sharp and burning sensation.  Worsened with lying flat or eating  food/fluids.  Also accompanied with hiccups.  No radiation straight through to the back.  Known Hx of worsening GERD.  [facility]23/[facility]21 for same.  Extensive work-up negative for acute findings.  Was recommended to follow-up with GI, however patient states he was never told to follow-up with anybody.  Was also given a prescription for a PPI and gabapentin.  Patient took these for "a few days", and has not taken them in at least the last few weeks.   HEART score of 2, low risk.  Considered ASA and nitroglycerin, however do not seem appropriate at this time.  PERC negative.  Well's score 0, low probability.  Labs unremarkable.  Unlikely pneumonia, no cough, no leukocytosis, no fevers, CXR and exam without acute findings.  Unlikely pneumothorax, no findings on CXR.  Unlikely pericarditis/myocarditis as does not fit clinical picture.  Chest pain not exertional.  No evidence  of pleural effusion or pulmonary edema on CXR.  Unlikely dissection, no pulse deficit, no tearing chest pain, no neurologic complaints.  EKG findings without evidence of ST elevation or depression, or dysrhythmia.  Troponin <2.  History, labs, and exam findings provide low suspicion for ACS.  Lipase unremarkable.  No radiation through to back.  No severe alcohol hx per pt.Marland Kitchen.  LFTs unremarkable.  No RUQ tenderness.  Low suspicion for pancreatitis at this time.  No evidence of anemia, no leukocytosis.  No hematemesis, hematochezia, or melena.  No vomiting/nausea since arrival to ED.  Upon re-evaluation, pt reports significant improvement with pepcid, pantoprazole, and maalox.  Patient's clinical presentation is most consistent with GERD/PUD, possible gastric ulcer. Reviewed imaging findings from reports in detail from ED visit 12/23/21, unable to see the images directly.  Referenced reports included above.  Overall workup was negative though there appears to have strong suspicion for GERD without active complications.  Discussed these in further detail with the patient.  Again had been recommended to follow up with GI.  Due to extensive imaging, negative work-up, patient's continued hemodynamic stability, no new symptoms, today's presentation and history, and significant improvement with pantoprazole, pepcid, and maalox, considered additional imaging however believe this may be appropriately deferred at this time.  Patient would likely most benefit from further evaluation with GI, possibly with an EGD/colonoscopy.  GI resources provided.  Strict return precautions discussed.  Pain managed in ED.  Prescription for PPI and Pepcid sent to pharmacy and discussed with patient in detail.  Also provided prescription for Miralax for gentle assistance with constipation.  Pt reports satisfaction with today's encounter.  Patient in NAD and in good condition at time of discharge.  Disposition: After consideration the patient's  encounter today, I do not feel today's workup suggests an emergent condition requiring admission or immediate intervention beyond what has been performed at this time.  Safe for discharge; instructed to return immediately for worsening symptoms, change in symptoms or any other concerns.  I have reviewed the patients home medicines and have made adjustments as needed.  Discussed course of treatment with the patient, whom demonstrated understanding.  Patient in agreement and has no further questions.    I discussed this case with attending physician Dr. Wallace CullensGray, who agreed with the proposed treatment course and cosigned this note.  Attending physician stated agreement with plan or made changes to plan which were implemented.     This chart was dictated using voice recognition software.  Despite best efforts to proofread, errors can occur which can change the  documentation meaning.         Final Clinical Impression(s) / ED Diagnoses Final diagnoses:  Epigastric pain  Heartburn    Rx / DC Orders ED Discharge Orders          Ordered    pantoprazole (PROTONIX) 40 MG tablet  Daily        03/13/22 1257    famotidine (PEPCID) 20 MG tablet  Every 12 hours PRN        03/13/22 1257    polyethylene glycol (MIRALAX / GLYCOLAX) 17 g packet  Daily        03/13/22 1339              Cecil Cobbs, [place] 03/13/22 1425    Edwin Dada P, DO 03/25/22 1238

## 2022-03-13 NOTE — ED Triage Notes (Signed)
Patient complains of ongoing reflux. Reports that he has been seen for same several times and has not had time. Has taken omeprazole with no relief. Complains of nonstop belching and epigastric pain

## 2022-03-13 NOTE — Discharge Instructions (Addendum)
You were evaluated in the emergency department today for acid reflux, and mid-chest/upper abdominal pain.  Your overall work-up today was reassuring, however it is very important to follow-up with gastroenterology specialist soon as possible.  Please call first thing Monday morning to schedule your follow-up appointment within the next 3 to 4 days.  It is also very important to take daily pantoprazole with Pepcid as needed.  These prescriptions have been sent to your pharmacy.  Pepcid may be taken every 12 hours as needed.  A prescription for Miralax has also been provided to help gently with bowel movements.  You may take this once daily, or every other day, or even once weekly to help with bowel movements.  Return to the emergency department for new or worsening symptoms as discussed.  You have also been provided the follow-up information for a primary care office.  Please call to establish care and for overall cohesive management within the next 1-2 days.
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
